# Patient Record
Sex: Female | Born: 1955 | Race: White | Hispanic: No | Marital: Single | Smoking: Never smoker
Health system: Southern US, Community
[De-identification: ages and names within clinical notes are randomized; demographics above are authoritative.]

## PROBLEM LIST (undated history)

## (undated) DIAGNOSIS — E079 Disorder of thyroid, unspecified: Secondary | ICD-10-CM

## (undated) DIAGNOSIS — E785 Hyperlipidemia, unspecified: Secondary | ICD-10-CM

## (undated) DIAGNOSIS — I10 Essential (primary) hypertension: Secondary | ICD-10-CM

## (undated) DIAGNOSIS — K219 Gastro-esophageal reflux disease without esophagitis: Secondary | ICD-10-CM

## (undated) DIAGNOSIS — F319 Bipolar disorder, unspecified: Secondary | ICD-10-CM

## (undated) DIAGNOSIS — R748 Abnormal levels of other serum enzymes: Secondary | ICD-10-CM

## (undated) HISTORY — PX: TONSILLECTOMY: SUR1361

## (undated) HISTORY — PX: CHOLECYSTECTOMY: SHX55

---

## 2016-11-07 ENCOUNTER — Encounter (HOSPITAL_COMMUNITY): Payer: Self-pay

## 2016-11-07 ENCOUNTER — Other Ambulatory Visit: Payer: Self-pay

## 2016-11-07 DIAGNOSIS — R0789 Other chest pain: Secondary | ICD-10-CM | POA: Diagnosis present

## 2016-11-07 DIAGNOSIS — I209 Angina pectoris, unspecified: Secondary | ICD-10-CM | POA: Diagnosis not present

## 2016-11-07 DIAGNOSIS — I10 Essential (primary) hypertension: Secondary | ICD-10-CM | POA: Diagnosis not present

## 2016-11-07 NOTE — ED Triage Notes (Signed)
Per EMS, pt from jail with complaint of left sided chest pain with radiation down left arm. Pt has hx of anxiety. Pt denies sob but endorses 3 episodes of vomiting this evening pta. Pt given 1 nitro without relief by ems. VSS. 12 lead showed NSR.

## 2016-11-08 ENCOUNTER — Emergency Department (HOSPITAL_COMMUNITY)

## 2016-11-08 ENCOUNTER — Observation Stay (HOSPITAL_COMMUNITY)
Admission: EM | Admit: 2016-11-08 | Discharge: 2016-11-09 | Disposition: A | Discharge status: Home / Self Care | Attending: Family Medicine | Admitting: Family Medicine

## 2016-11-08 DIAGNOSIS — E669 Obesity, unspecified: Secondary | ICD-10-CM

## 2016-11-08 DIAGNOSIS — F319 Bipolar disorder, unspecified: Secondary | ICD-10-CM

## 2016-11-08 DIAGNOSIS — E785 Hyperlipidemia, unspecified: Secondary | ICD-10-CM

## 2016-11-08 DIAGNOSIS — F1910 Other psychoactive substance abuse, uncomplicated: Secondary | ICD-10-CM

## 2016-11-08 DIAGNOSIS — I209 Angina pectoris, unspecified: Secondary | ICD-10-CM | POA: Diagnosis present

## 2016-11-08 DIAGNOSIS — K219 Gastro-esophageal reflux disease without esophagitis: Secondary | ICD-10-CM

## 2016-11-08 DIAGNOSIS — N179 Acute kidney failure, unspecified: Secondary | ICD-10-CM

## 2016-11-08 DIAGNOSIS — E079 Disorder of thyroid, unspecified: Secondary | ICD-10-CM

## 2016-11-08 DIAGNOSIS — I10 Essential (primary) hypertension: Secondary | ICD-10-CM

## 2016-11-08 HISTORY — DX: Abnormal levels of other serum enzymes: R74.8

## 2016-11-08 HISTORY — DX: Essential (primary) hypertension: I10

## 2016-11-08 HISTORY — DX: Disorder of thyroid, unspecified: E07.9

## 2016-11-08 HISTORY — DX: Bipolar disorder, unspecified: F31.9

## 2016-11-08 HISTORY — DX: Gastro-esophageal reflux disease without esophagitis: K21.9

## 2016-11-08 HISTORY — DX: Hyperlipidemia, unspecified: E78.5

## 2016-11-08 LAB — LIPID PANEL
CHOLESTEROL: 228 mg/dL — AB (ref 0–200)
HDL: 68 mg/dL (ref >40)
LDL Cholesterol: 133 mg/dL — ABNORMAL HIGH (ref 0–99)
TRIGLYCERIDES: 135 mg/dL (ref <150)
Total CHOL/HDL Ratio: 3.4 RATIO
VLDL: 27 mg/dL (ref 0–40)

## 2016-11-08 LAB — CBC
HCT: 42.4 % (ref 36.0–46.0)
Hemoglobin: 13.8 g/dL (ref 12.0–15.0)
MCH: 30.3 pg (ref 26.0–34.0)
MCHC: 32.5 g/dL (ref 30.0–36.0)
MCV: 93.2 fL (ref 78.0–100.0)
Platelets: 197 10*3/uL (ref 150–400)
RBC: 4.55 MIL/uL (ref 3.87–5.11)
RDW: 14.2 % (ref 11.5–15.5)
WBC: 9.7 10*3/uL (ref 4.0–10.5)

## 2016-11-08 LAB — TROPONIN I
Troponin I: <0.03 ng/mL (ref <0.03)
Troponin I: <0.03 ng/mL (ref <0.03)
Troponin I: <0.03 ng/mL (ref <0.03)

## 2016-11-08 LAB — BASIC METABOLIC PANEL
Anion gap: 10 (ref 5–15)
BUN: 16 mg/dL (ref 6–20)
CALCIUM: 9.5 mg/dL (ref 8.9–10.3)
CO2: 23 mmol/L (ref 22–32)
CREATININE: 1.3 mg/dL — AB (ref 0.44–1.00)
Chloride: 106 mmol/L (ref 101–111)
GFR calc Af Amer: 51 mL/min — ABNORMAL LOW (ref >60)
GFR, EST NON AFRICAN AMERICAN: 44 mL/min — AB (ref >60)
GLUCOSE: 104 mg/dL — AB (ref 65–99)
Potassium: 3.8 mmol/L (ref 3.5–5.1)
Sodium: 139 mmol/L (ref 135–145)

## 2016-11-08 LAB — TSH: TSH: 4.613 u[IU]/mL — ABNORMAL HIGH (ref 0.350–4.500)

## 2016-11-08 LAB — MRSA PCR SCREENING: MRSA BY PCR: NEGATIVE

## 2016-11-08 MED ORDER — SODIUM CHLORIDE 0.9 % IV SOLN
INTRAVENOUS | Status: DC
  Administered 2016-11-08 (×2): via INTRAVENOUS

## 2016-11-08 MED ORDER — POLYETHYLENE GLYCOL 3350 17 G PO PACK
17.0000 g | PACK | Freq: Every day | ORAL | Status: DC
  Administered 2016-11-08 – 2016-11-09 (×2): 17 g via ORAL
  Filled 2016-11-08 (×2): qty 1

## 2016-11-08 MED ORDER — ONDANSETRON HCL 4 MG/2ML IJ SOLN
4.0000 mg | Freq: Four times a day (QID) | INTRAMUSCULAR | Status: DC | PRN
  Administered 2016-11-08: 4 mg via INTRAVENOUS
  Filled 2016-11-08: qty 2

## 2016-11-08 MED ORDER — GI COCKTAIL ~~LOC~~
30.0000 mL | Freq: Four times a day (QID) | ORAL | Status: DC | PRN
  Administered 2016-11-09: 30 mL via ORAL
  Filled 2016-11-08: qty 30

## 2016-11-08 MED ORDER — NITROGLYCERIN 0.4 MG SL SUBL: 0.4000 mg | SUBLINGUAL_TABLET | SUBLINGUAL | Status: DC | PRN

## 2016-11-08 MED ORDER — ONDANSETRON 4 MG PO TBDP
ORAL_TABLET | ORAL | Status: AC
  Administered 2016-11-08: 4 mg
  Filled 2016-11-08: qty 1

## 2016-11-08 MED ORDER — ASPIRIN 81 MG PO CHEW
324.0000 mg | CHEWABLE_TABLET | Freq: Once | ORAL | Status: AC
  Administered 2016-11-08: 324 mg via ORAL
  Filled 2016-11-08: qty 4

## 2016-11-08 MED ORDER — MORPHINE SULFATE (PF) 4 MG/ML IV SOLN
2.0000 mg | INTRAVENOUS | Status: DC | PRN
  Administered 2016-11-08 – 2016-11-09 (×4): 2 mg via INTRAVENOUS
  Filled 2016-11-08 (×4): qty 1

## 2016-11-08 MED ORDER — POLYETHYLENE GLYCOL 3350 17 GM/SCOOP PO POWD
1.0000 | Freq: Once | ORAL | Status: DC
  Filled 2016-11-08: qty 255

## 2016-11-08 MED ORDER — NITROGLYCERIN 2 % TD OINT
1.0000 [in_us] | TOPICAL_OINTMENT | Freq: Four times a day (QID) | TRANSDERMAL | Status: DC
  Administered 2016-11-08 (×2): 1 [in_us] via TOPICAL
  Filled 2016-11-08: qty 30
  Filled 2016-11-08 (×2): qty 1

## 2016-11-08 MED ORDER — ACETAMINOPHEN 325 MG PO TABS
650.0000 mg | ORAL_TABLET | ORAL | Status: DC | PRN
  Administered 2016-11-08 – 2016-11-09 (×3): 650 mg via ORAL
  Filled 2016-11-08 (×3): qty 2

## 2016-11-08 MED ORDER — ZOLPIDEM TARTRATE 5 MG PO TABS: 5.0000 mg | ORAL_TABLET | Freq: Every evening | ORAL | Status: DC | PRN

## 2016-11-08 MED ORDER — ENOXAPARIN SODIUM 40 MG/0.4ML ~~LOC~~ SOLN
40.0000 mg | SUBCUTANEOUS | Status: DC
  Administered 2016-11-08 – 2016-11-09 (×2): 40 mg via SUBCUTANEOUS
  Filled 2016-11-08 (×2): qty 0.4

## 2016-11-08 MED ORDER — ASPIRIN EC 325 MG PO TBEC
325.0000 mg | DELAYED_RELEASE_TABLET | Freq: Every day | ORAL | Status: DC
  Administered 2016-11-09: 325 mg via ORAL
  Filled 2016-11-08: qty 1

## 2016-11-08 MED ORDER — MORPHINE SULFATE (PF) 4 MG/ML IV SOLN: 1.0000 mg | INTRAVENOUS | Status: DC | PRN

## 2016-11-08 MED ORDER — MORPHINE SULFATE (PF) 4 MG/ML IV SOLN
4.0000 mg | Freq: Once | INTRAVENOUS | Status: AC
  Administered 2016-11-08: 4 mg via INTRAVENOUS
  Filled 2016-11-08: qty 1

## 2016-11-08 MED ORDER — ALPRAZOLAM 0.25 MG PO TABS
0.2500 mg | ORAL_TABLET | Freq: Two times a day (BID) | ORAL | Status: DC | PRN
  Filled 2016-11-08: qty 1

## 2016-11-08 NOTE — ED Notes (Signed)
Breakfast tray delivered

## 2016-11-08 NOTE — ED Notes (Signed)
Pt complaining of nausea. Pt given zofran odt by this RN

## 2016-11-08 NOTE — ED Notes (Signed)
Patient complains of recurrent headache, morphine given. Patient alert and oriented, reports that her chest pain is intermittent. No distress on reassessment

## 2016-11-08 NOTE — Progress Notes (Signed)
Pt admitted to 3w19. Pt oriented to the room.

## 2016-11-08 NOTE — H&P (Signed)
This is a no charge note   Pending admission per Dr. Rhunette CroftNanavati  60 year old female lady with past medical history hypertension, hyperlipidemia, GERD, depression, bipolar, who presents left-sided chest pain, radiating into the left arm. The chest pain subsided after treated with nitroglycerin by EMS. Patient also had nausea and vomited 3 times. EKG has no ischemic change.  Initial troponin negative. Negative CXR. Patient is accepted.  Lorretta HarpXilin Adonai Selsor, MD  [facility] Pager [phone]  If 7PM-7AM, please contact night-coverage [email or web address] Password [email or web address] 11/08/2016, 6:26 AM

## 2016-11-08 NOTE — H&P (Signed)
History and Physical    Connie Mcdonald ZOX:[id number] DOB: October 01, 1956 DOA: 11/08/2016   PCP: Pcp Not In System   Patient coming from:  Prison  Chief Complaint: Chest pain   HPI: Connie Mcdonald is a 60 y.o. female with medical history significant for HTN, HLD, Gerd, depression, bipolar disorder, crack - cocaine abuse, currently incarcerated for drug possession, brought to the emergency department with acute onset of left sided chest pain radiating to the left arm, without extension to the jaw. The pain was described as 6 out of 10, subsiding after the treatment with nitroglycerin in an aspirin by EMS. The patient reported nausea and vomiting 3 times at the emergency department. Last crack intake 2 days ago. Pain began around 5 PM last night, that she did not reported until midnight. At the ED, she did not have any further episodes. Denies dizziness or falls. No syncope or presyncope, she denies any shortness of breath or call. She denies any fever or chills. She denies any abdominal pain and appetite is normal. She does eats salt rich foods. She denies any leg swelling or calf pain. She denies any headaches or vision changes or seizures. No confusion is reported. She has never been seen by a cardiologist, or had a cardiac catheterization or echocardiogram. No prior long distance trips before incarceration. She denies any hormonal therapy, but she is menopausal. She denies any herbal supplements, smoking or alcohol. She denies any family history of cardiac disease. The new stressors is being incarcerated.   ED Course:  BP 111/72 (BP Location: Right Arm)   Pulse 76   Temp 97.8 F (36.6 C) (Oral)   Resp 18   Ht 5\' 9"  (1.753 m)   Wt 108.9 kg (240 lb)   SpO2 96%   BMI 35.44 kg/m    vital signs are currently stable, EKG without ACS. Initial troponin is negative. Chest x-ray is negative for acute changes. Sodium 139 potassium 3.8 creatinine 1.3 white count 9.7 hemoglobin 13.8 platelets 197 glucose  104  Review of Systems: As per HPI otherwise 10 point review of systems negative.   Past Medical History:  Diagnosis Date  . Acid reflux   . Bipolar 1 disorder (HCC)   . Elevated liver enzymes   . Hyperlipidemia   . Hypertension   . Manic depression (HCC)   . Thyroid disease     Past Surgical History:  Procedure Laterality Date  . CHOLECYSTECTOMY    . TONSILLECTOMY      Social History Social History   Social History  . Marital status: Single    Spouse name: N/A  . Number of children: N/A  . Years of education: N/A   Occupational History  . Not on file.   Social History Main Topics  . Smoking status: Never Smoker  . Smokeless tobacco: Never Used  . Alcohol use Yes     Comment: occ  . Drug use: No  . Sexual activity: Not on file   Other Topics Concern  . Not on file   Social History Narrative  . No narrative on file     No Known Allergies  History reviewed. No pertinent family history.    Prior to Admission medications   Not on File    Physical Exam:    Vitals:   11/08/16 0608 11/08/16 0630 11/08/16 0715 11/08/16 0811  BP: 126/73  103/68 111/72  Pulse: 75 85 76 76  Resp: 23 19 20 18   Temp:  TempSrc:      SpO2: 97% 97% 94% 96%  Weight:      Height:           Constitutional: NAD, calm, comfortable  Vitals:   11/08/16 0608 11/08/16 0630 11/08/16 0715 11/08/16 0811  BP: 126/73  103/68 111/72  Pulse: 75 85 76 76  Resp: 23 19 20 18   Temp:      TempSrc:      SpO2: 97% 97% 94% 96%  Weight:      Height:       Eyes: PERRL, lids and conjunctivae normal ENMT: Mucous membranes are moist. Posterior pharynx clear of any exudate or lesions.Normal dentition.  Neck: normal, supple, no masses, no thyromegaly Respiratory: clear to auscultation bilaterally, no wheezing, no crackles. Normal respiratory effort. No accessory muscle use.  Cardiovascular: Regular rate and rhythm, no murmurs / rubs / gallops. No extremity edema. 2+ pedal pulses. No  carotid bruits.  Abdomen:  Obese no tenderness, no masses palpated. No hepatosplenomegaly. Bowel sounds positive.  Musculoskeletal: no clubbing / cyanosis. No joint deformity upper and lower extremities. Good ROM, no contractures. Normal muscle tone.  Skin: no rashes, lesions, ulcers.  Neurologic: CN 2-12 grossly intact. Sensation intact, DTR normal. Strength 5/5 in all 4.  Psychiatric: Normal judgment and insight. Alert and oriented x 3.Anxious mood.     Labs on Admission: I have personally reviewed following labs and imaging studies  CBC:  Recent Labs Lab 11/07/16 2353  WBC 9.7  HGB 13.8  HCT 42.4  MCV 93.2  PLT 197    Basic Metabolic Panel:  Recent Labs Lab 11/07/16 2353  NA 139  K 3.8  CL 106  CO2 23  GLUCOSE 104*  BUN 16  CREATININE 1.30*  CALCIUM 9.5    GFR: Estimated Creatinine Clearance: 62 mL/min (by C-G formula based on SCr of 1.3 mg/dL (H)).  Liver Function Tests: No results for input(s): AST, ALT, ALKPHOS, BILITOT, PROT, ALBUMIN in the last 168 hours. No results for input(s): LIPASE, AMYLASE in the last 168 hours. No results for input(s): AMMONIA in the last 168 hours.  Coagulation Profile: No results for input(s): INR, PROTIME in the last 168 hours.  Cardiac Enzymes:  Recent Labs Lab 11/07/16 2353 11/08/16 0337  TROPONINI <0.03 <0.03    BNP (last 3 results) No results for input(s): PROBNP in the last 8760 hours.  HbA1C: No results for input(s): HGBA1C in the last 72 hours.  CBG: No results for input(s): GLUCAP in the last 168 hours.  Lipid Profile: No results for input(s): CHOL, HDL, LDLCALC, TRIG, CHOLHDL, LDLDIRECT in the last 72 hours.  Thyroid Function Tests: No results for input(s): TSH, T4TOTAL, FREET4, T3FREE, THYROIDAB in the last 72 hours.  Anemia Panel: No results for input(s): VITAMINB12, FOLATE, FERRITIN, TIBC, IRON, RETICCTPCT in the last 72 hours.  Urine analysis: No results found for: COLORURINE, APPEARANCEUR,  LABSPEC, PHURINE, GLUCOSEU, HGBUR, BILIRUBINUR, KETONESUR, PROTEINUR, UROBILINOGEN, NITRITE, LEUKOCYTESUR  Sepsis Labs: @LABRCNTIP (procalcitonin:4,lacticidven:4) )No results found for this or any previous visit (from the past 240 hour(s)).   Radiological Exams on Admission: Dg Chest 2 View  Result Date: 11/08/2016 CLINICAL DATA:  Left-sided chest pain and left arm pain for 2 hours. EXAM: CHEST  2 VIEW COMPARISON:  None. FINDINGS: The cardiomediastinal contours are normal. The lungs are clear. Pulmonary vasculature is normal. No consolidation, pleural effusion, or pneumothorax. No acute osseous abnormalities are seen. IMPRESSION: No acute pulmonary process. Electronically Signed   By: Lujean RaveMelanie  Ehinger M.D.  On: 11/08/2016 00:34    EKG: Independently reviewed.  Assessment/Plan Active Problems:   Angina pectoris (HCC)   Drug abuse   Obesity   Hypertension   Hyperlipidemia   Bipolar disorder (HCC)   Manic depression (HCC)   Thyroid disease   GERD (gastroesophageal reflux disease)   AKI (acute kidney injury) (HCC)    Chest pain syndrome, cardiac versus musculoskeletal vs anxietyin the setting of recent crack use Troponin 0.03 , EKG without evidence of ACS. CP relieved by nitroglycerin, morphine, aspirin. CXR unrevealing.   Admit to Telemetry/ Observation Chest pain order set Cycle troponins EKG in am continue ASA, O2 and NTG as needed No BB given due to recent drug use  GI cocktail Check Lipid panel  Hb A1C Consult to Cards if  Tn or EKG changes  Monitor for drug withdrawal symptoms     Hypertension BP 111/72  Pulse 76    Continue to monitor    Hyperlipidemia patient not on statins, check lipid panel   Obesity  Heart healthy diet  Check Hb A1C   GERD,  Continue PPI  Hypothyroidism: Patient was not on meds due to inability to afford Check TSH   Acute Kidney Injury likely due to dehydration due to vomiting  Lab Results  Component Value Date   CREATININE  1.30 (H) 11/07/2016  IVF Check UA    Bipolar Disorder/ Manic Depression. No active issues at this time  Patient not on meds.  Will need Psych eval as OP   Substance abuse, last crack intake 2 days ago  Monitor for withdrawal  Xanax and Ambien prn    DVT prophylaxis: Lovenox   Code Status:   Full      Family Communication:  Discussed with patient Disposition Plan: Expect patient to be discharged to prison after condition improves Consults called:    None Admission status:Tele  Obs  Shelita Steptoe E, PA-C [facility]   11/08/2016, 8:37 AM

## 2016-11-08 NOTE — ED Provider Notes (Signed)
[facility]-EMERGENCY DEPT Provider Note   CSN: [id number] Arrival date & time: 11/07/16  2339  By signing my name below, I, Connie Mcdonald, attest that this documentation has been prepared under the direction and in the presence of Derwood Kaplan, MD. Electronically Signed: Javier Mcdonald, ER Scribe. 06/21/2016. 3:37 AM.  History   Chief Complaint Chief Complaint  Patient presents with  . Chest Pain  . Headache   The history is provided by the patient. No language interpreter was used.    HPI Comments: Connie Mcdonald is a 60 y.o. female brought in [facility] jail who presents to the Emergency Department complaining of chest discomfort for the past two weeks that got worse today with associated left arm pain, numbness and tingling in her arm. She has a PMHx of HTN, HLD, panic attacks and prediabetes. She states her current sx are not similar to her panic attack sx. She denies family hx of heart disease. She has been prescribed vistaril in the past for panic attacks, but is not currently taking that medication. She has never had a stress test. The last time she ate was around 2pm yesterday. She was given nitroglycerine PTA which lowered her CP from a 9/10 to a 6/10. She states her pain is currently 3/10. She has no past hx of blood clots or recent major surgeries. Her CP is worse with deep breathing. She has NKDA.  Past Medical History:  Diagnosis Date  . Acid reflux   . Bipolar 1 disorder (HCC)   . Elevated liver enzymes   . Hyperlipidemia   . Hypertension   . Manic depression (HCC)   . Thyroid disease     Patient Active Problem List   Diagnosis Date Noted  . Angina pectoris (HCC) 11/08/2016  . Drug abuse 11/08/2016  . Obesity 11/08/2016  . Hypertension 11/08/2016  . Hyperlipidemia 11/08/2016  . Bipolar disorder (HCC) 11/08/2016  . Manic depression (HCC) 11/08/2016  . Thyroid disease 11/08/2016  . GERD (gastroesophageal reflux disease) 11/08/2016  . AKI (acute  kidney injury) (HCC) 11/08/2016    Past Surgical History:  Procedure Laterality Date  . CHOLECYSTECTOMY    . TONSILLECTOMY      OB History    No data available       Home Medications    Prior to Admission medications   Medication Sig Start Date End Date Taking? Authorizing Provider  DULoxetine (CYMBALTA) 30 MG capsule Take 1 capsule (30 mg total) by mouth daily. 11/10/16   Lenox Ponds, MD  hydrOXYzine (ATARAX/VISTARIL) 10 MG tablet Take 1 tablet (10 mg total) by mouth every 6 (six) hours as needed for anxiety. 11/09/16   Lenox Ponds, MD    Family History History reviewed. No pertinent family history.  Social History Social History  Substance Use Topics  . Smoking status: Never Smoker  . Smokeless tobacco: Never Used  . Alcohol use Yes     Comment: occ     Allergies   Patient has no known allergies.   Review of Systems Review of Systems  Constitutional: Negative for chills and fever.  Respiratory: Positive for chest tightness. Negative for shortness of breath.   Cardiovascular: Positive for chest pain.  A complete 10 system review of systems was obtained and all systems are negative except as noted in the HPI and PMH.    Physical Exam Updated Vital Signs BP 138/78 (BP Location: Left Arm)   Pulse 71   Temp 98.3 F (36.8  C) (Oral)   Resp 18   Ht 5\' 9"  (1.753 m)   Wt 241 lb 1.6 oz (109.4 kg)   SpO2 97%   BMI 35.60 kg/m   Physical Exam  Constitutional: She is oriented to person, place, and time. She appears well-developed and well-nourished. No distress.  HENT:  Head: Normocephalic and atraumatic.  Eyes: Pupils are equal, round, and reactive to light.  Neck: Neck supple.  Cardiovascular: Normal rate.   Pulmonary/Chest: Effort normal. No respiratory distress.  Musculoskeletal: Normal range of motion.  Neurological: She is alert and oriented to person, place, and time. Coordination normal.  Skin: Skin is warm and dry. She is not diaphoretic.   Psychiatric: She has a normal mood and affect. Her behavior is normal.  Nursing note and vitals reviewed.    ED Treatments / Results  Labs (all labs ordered are listed, but only abnormal results are displayed) Labs Reviewed  BASIC METABOLIC PANEL - Abnormal; Notable for the following:       Result Value   Glucose, Bld 104 (*)    Creatinine, Ser 1.30 (*)    GFR calc non Af Amer 44 (*)    GFR calc Af Amer 51 (*)    All other components within normal limits  LIPID PANEL - Abnormal; Notable for the following:    Cholesterol 228 (*)    LDL Cholesterol 133 (*)    All other components within normal limits  TSH - Abnormal; Notable for the following:    TSH 4.613 (*)    All other components within normal limits  URINALYSIS, ROUTINE W REFLEX MICROSCOPIC - Abnormal; Notable for the following:    APPearance HAZY (*)    Leukocytes, UA LARGE (*)    Bacteria, UA RARE (*)    Squamous Epithelial / LPF 0-5 (*)    All other components within normal limits  CBC - Abnormal; Notable for the following:    Platelets 142 (*)    All other components within normal limits  COMPREHENSIVE METABOLIC PANEL - Abnormal; Notable for the following:    BUN 21 (*)    Creatinine, Ser 1.30 (*)    Calcium 8.8 (*)    Albumin 3.1 (*)    GFR calc non Af Amer 44 (*)    GFR calc Af Amer 51 (*)    All other components within normal limits  MRSA PCR SCREENING  CBC  TROPONIN I  TROPONIN I  TROPONIN I  TROPONIN I  TROPONIN I  HEMOGLOBIN A1C  T4, FREE    EKG  EKG Interpretation  Date/Time:  Saturday November 08 2016 13:06:41 EST Ventricular Rate:  71 PR Interval:  150 QRS Duration: 84 QT Interval:  390 QTC Calculation: 424 R Axis:   33 Text Interpretation:  Sinus rhythm No acute changes Confirmed by LIU MD, Annabelle HarmanANA ([id number]) on 11/09/2016 1:39:26 PM       Radiology No results found.  Procedures Procedures (including critical care time)  Medications Ordered in ED Medications  ondansetron  (ZOFRAN-ODT) 4 MG disintegrating tablet (4 mg  Given 11/08/16 0150)  aspirin chewable tablet 324 mg (324 mg Oral Given 11/08/16 0413)  morphine 4 MG/ML injection 4 mg (4 mg Intravenous Given 11/08/16 0414)     Initial Impression / Assessment and Plan / ED Course  I have reviewed the triage vital signs and the nursing notes.  Pertinent labs & imaging results that were available during my care of the patient were reviewed by me and considered in  my medical decision making (see chart for details).  Clinical Course    Pt comes in with cc of chest pain. Intermittent for the past few days, but more pronounced yday. Pt's pain has typical features. EKG is however reassuring. Pt received nitro by EMS and has been chest pain free. Pt has hx of cocaine use and smoking. Her pain has some atypical features -for e/g she thinks that the pain was worse with inspiration. However, with HEAR score of 5 (2 for hx and 2 for risk factor and 1 for age), and pt currently imprisoned - we will admit for ACS r/o.   Final Clinical Impressions(s) / ED Diagnoses   Final diagnoses:  Angina pectoris [facility](HCC)    New Prescriptions Discharge Medication List as of 11/09/2016  5:38 PM    START taking these medications   Details  DULoxetine (CYMBALTA) 30 MG capsule Take 1 capsule (30 mg total) by mouth daily., Starting Mon 11/10/2016, No Print    hydrOXYzine (ATARAX/VISTARIL) 10 MG tablet Take 1 tablet (10 mg total) by mouth every 6 (six) hours as needed for anxiety., Starting Sun 11/09/2016, No Print        I personally performed the services described in this documentation, which was scribed in my presence. The recorded information has been reviewed and is accurate.        Derwood KaplanAnkit Ladavion Savitz, MD 11/10/16 [phone]

## 2016-11-08 NOTE — ED Notes (Addendum)
Breakfast tray ordered 

## 2016-11-09 ENCOUNTER — Observation Stay (HOSPITAL_BASED_OUTPATIENT_CLINIC_OR_DEPARTMENT_OTHER)

## 2016-11-09 DIAGNOSIS — F411 Generalized anxiety disorder: Secondary | ICD-10-CM

## 2016-11-09 DIAGNOSIS — R079 Chest pain, unspecified: Secondary | ICD-10-CM | POA: Diagnosis not present

## 2016-11-09 DIAGNOSIS — I10 Essential (primary) hypertension: Secondary | ICD-10-CM | POA: Diagnosis not present

## 2016-11-09 DIAGNOSIS — F310 Bipolar disorder, current episode hypomanic: Secondary | ICD-10-CM

## 2016-11-09 DIAGNOSIS — I209 Angina pectoris, unspecified: Secondary | ICD-10-CM

## 2016-11-09 DIAGNOSIS — E6609 Other obesity due to excess calories: Secondary | ICD-10-CM

## 2016-11-09 LAB — URINALYSIS, ROUTINE W REFLEX MICROSCOPIC
BILIRUBIN URINE: NEGATIVE
GLUCOSE, UA: NEGATIVE mg/dL
HGB URINE DIPSTICK: NEGATIVE
Ketones, ur: NEGATIVE mg/dL
NITRITE: NEGATIVE
PROTEIN: NEGATIVE mg/dL
Specific Gravity, Urine: 1.015 (ref 1.005–1.030)
pH: 6 (ref 5.0–8.0)

## 2016-11-09 LAB — COMPREHENSIVE METABOLIC PANEL WITH GFR
ALT: 42 U/L (ref 14–54)
AST: 30 U/L (ref 15–41)
Albumin: 3.1 g/dL — ABNORMAL LOW (ref 3.5–5.0)
Alkaline Phosphatase: 83 U/L (ref 38–126)
Anion gap: 5 (ref 5–15)
BUN: 21 mg/dL — ABNORMAL HIGH (ref 6–20)
CO2: 25 mmol/L (ref 22–32)
Calcium: 8.8 mg/dL — ABNORMAL LOW (ref 8.9–10.3)
Chloride: 109 mmol/L (ref 101–111)
Creatinine, Ser: 1.3 mg/dL — ABNORMAL HIGH (ref 0.44–1.00)
GFR calc Af Amer: 51 mL/min — ABNORMAL LOW
GFR calc non Af Amer: 44 mL/min — ABNORMAL LOW
Glucose, Bld: 98 mg/dL (ref 65–99)
Potassium: 3.9 mmol/L (ref 3.5–5.1)
Sodium: 139 mmol/L (ref 135–145)
Total Bilirubin: 0.7 mg/dL (ref 0.3–1.2)
Total Protein: 6.7 g/dL (ref 6.5–8.1)

## 2016-11-09 LAB — HEMOGLOBIN A1C
Hgb A1c MFr Bld: 5.5 % (ref 4.8–5.6)
Mean Plasma Glucose: 111 mg/dL

## 2016-11-09 LAB — CBC
HCT: 40.5 % (ref 36.0–46.0)
Hemoglobin: 12.9 g/dL (ref 12.0–15.0)
MCH: 30.1 pg (ref 26.0–34.0)
MCHC: 31.9 g/dL (ref 30.0–36.0)
MCV: 94.4 fL (ref 78.0–100.0)
PLATELETS: 142 10*3/uL — AB (ref 150–400)
RBC: 4.29 MIL/uL (ref 3.87–5.11)
RDW: 14.2 % (ref 11.5–15.5)
WBC: 8.8 10*3/uL (ref 4.0–10.5)

## 2016-11-09 LAB — ECHOCARDIOGRAM COMPLETE
HEIGHTINCHES: 69 in
WEIGHTICAEL: 3857.6 [oz_av]

## 2016-11-09 LAB — T4, FREE: FREE T4: 0.85 ng/dL (ref 0.61–1.12)

## 2016-11-09 MED ORDER — DULOXETINE HCL 30 MG PO CPEP: 30.0000 mg | ORAL_CAPSULE | Freq: Every day | ORAL | 3 refills | Status: AC

## 2016-11-09 MED ORDER — TRAMADOL HCL 50 MG PO TABS
50.0000 mg | ORAL_TABLET | Freq: Four times a day (QID) | ORAL | Status: DC | PRN
  Administered 2016-11-09: 50 mg via ORAL
  Filled 2016-11-09: qty 1

## 2016-11-09 MED ORDER — HYDROXYZINE HCL 10 MG PO TABS: 10.0000 mg | ORAL_TABLET | Freq: Four times a day (QID) | ORAL | 0 refills | Status: AC | PRN

## 2016-11-09 MED ORDER — DULOXETINE HCL 30 MG PO CPEP
30.0000 mg | ORAL_CAPSULE | Freq: Every day | ORAL | Status: DC
  Administered 2016-11-09: 30 mg via ORAL
  Filled 2016-11-09: qty 1

## 2016-11-09 MED ORDER — HYDROXYZINE HCL 10 MG PO TABS
10.0000 mg | ORAL_TABLET | Freq: Four times a day (QID) | ORAL | Status: DC | PRN
  Administered 2016-11-09: 10 mg via ORAL
  Filled 2016-11-09: qty 1

## 2016-11-09 NOTE — Consult Note (Signed)
Admit date: 11/08/2016 Referring Physician  Dr. Sharion DoveZapata Primary Physician Pcp Not In System Primary Cardiologist  New Reason for Consultation  Chest pain  HPI: 60 year old female prisoner with history of hypertension, hyperlipidemia, bipolar disorder who was admitted with chest pain. She describes chest pain since Friday pressure-like sometimes shooting down her left arm periodically. She is also mainly concerned as well about numbness in her left hand especially when sleeping that wakes her up periodically. Her brother recently had cervical spine surgery to help with radiculopathy. She also notes that she "threw up "several times yesterday. No hematemesis. She also has a history of elevated liver enzyme she told me.  She does not smoke. She does not have diabetes. Her mother and father both had hypertension.   She is asking to eat.   PMH:   Past Medical History:  Diagnosis Date  . Acid reflux   . Bipolar 1 disorder (HCC)   . Elevated liver enzymes   . Hyperlipidemia   . Hypertension   . Manic depression (HCC)   . Thyroid disease     PSH:   Past Surgical History:  Procedure Laterality Date  . CHOLECYSTECTOMY    . TONSILLECTOMY     Allergies:  Patient has no known allergies. Prior to Admit Meds:   Prior to Admission medications   Not on File    Scheduled Meds: . aspirin EC  325 mg Oral Daily  . enoxaparin (LOVENOX) injection  40 mg Subcutaneous Q24H  . nitroGLYCERIN  1 inch Topical Q6H  . polyethylene glycol  17 g Oral Daily   Continuous Infusions: . sodium chloride 75 mL/hr at 11/08/16 1929   PRN Meds:.acetaminophen, ALPRAZolam, gi cocktail, nitroGLYCERIN, ondansetron (ZOFRAN) IV, zolpidem  Fam HX:   Both mother and father had hypertension, no early CAD Social HX:    Social History   Social History  . Marital status: Single    Spouse name: N/A  . Number of children: N/A  . Years of education: N/A   Occupational History  . Not on file.   Social History  Main Topics  . Smoking status: Never Smoker  . Smokeless tobacco: Never Used  . Alcohol use Yes     Comment: occ  . Drug use: No  . Sexual activity: Not on file   Other Topics Concern  . Not on file   Social History Narrative  . No narrative on file     ROS:  No syncope, no bleeding, no orthopnea, no PND. All 11 ROS were addressed and are negative except what is stated in the HPI   Physical Exam: Blood pressure 114/80, pulse 74, temperature 98 F (36.7 C), temperature source Oral, resp. rate 19, height 5\' 9"  (1.753 m), weight 241 lb 1.6 oz (109.4 kg), SpO2 95 %.   General: Well developed, well nourished, in no acute distress Head: Eyes PERRLA, No xanthomas.   Normal cephalic and atramatic  Lungs:   Clear bilaterally to auscultation and percussion. Normal respiratory effort. No wheezes, no rales. Heart:   HRRR S1 S2 Pulses are 2+ & equal. No murmur, rubs, gallops.  No carotid bruit. No JVD.  No abdominal bruits.  Abdomen: Bowel sounds are positive, abdomen soft and non-tender without masses. No hepatosplenomegaly. Obese Msk:  Back normal. Normal strength and tone for age. Extremities:  No clubbing, cyanosis or edema.  DP +1 Neuro: Alert and oriented X 3, non-focal, MAE x 4 GU: Deferred Rectal: Deferred Psych:  Good affect, responds appropriately  Labs: Lab Results  Component Value Date   WBC 8.8 11/09/2016   HGB 12.9 11/09/2016   HCT 40.5 11/09/2016   MCV 94.4 11/09/2016   PLT 142 (L) 11/09/2016     Recent Labs Lab 11/09/16 0707  NA 139  K 3.9  CL 109  CO2 25  BUN 21*  CREATININE 1.30*  CALCIUM 8.8*  PROT 6.7  BILITOT 0.7  ALKPHOS 83  ALT 42  AST 30  GLUCOSE 98    Recent Labs  11/08/16 0337 11/08/16 0758 11/08/16 1312 11/08/16 1908  TROPONINI <0.03 <0.03 <0.03 <0.03   Lab Results  Component Value Date   CHOL 228 (H) 11/08/2016   HDL 68 11/08/2016   LDLCALC 133 (H) 11/08/2016   TRIG 135 11/08/2016   No results found for: DDIMER     Radiology:  Dg Chest 2 View  Result Date: 11/08/2016 CLINICAL DATA:  Left-sided chest pain and left arm pain for 2 hours. EXAM: CHEST  2 VIEW COMPARISON:  None. FINDINGS: The cardiomediastinal contours are normal. The lungs are clear. Pulmonary vasculature is normal. No consolidation, pleural effusion, or pneumothorax. No acute osseous abnormalities are seen. IMPRESSION: No acute pulmonary process. Electronically Signed   By: Rubye OaksMelanie  Ehinger M.D.   On: 11/08/2016 00:34   Personally viewed.  EKG:  Sinus rhythm, no ST segment changes Personally viewed.   Echocardiogram: 11/09/16-  - Reviewed images, ejection fraction normal, no wall motion abnormality, diastolic dysfunction grade 1, no significant valvular abnormalities noted. Reassuring.  ASSESSMENT/PLAN:    60 year old female with hypertension, hyperlipidemia, bipolar disorder, prisoner with chest pain, left arm pain, left hand numbness, recent emesis, GERD.  Atypical chest pain  - Certainly her discomfort could be attributed to possible gastroenteritis especially given her multiple bouts of emesis yesterday. She's been having her discomfort since Friday, there is no evidence of elevated troponin thankfully. No exertional component. Her EKG is without ST segment changes. Echocardiogram also reassuring showing no wall motion abnormalities with normal ejection fraction. Her left arm discomfort is periodically described as shooting pain which is most likely neuropathic and her left hand numbness is likely associated with carpal tunnel syndrome which she has been told in the past she may have.  - No further cardiac testing warranted. Reassuring workup.  - Comfortable with discharge from a cardiac perspective.  - Consider addition of statin therapy given LDL of 138  - Continue with aspirin 81 mg  - Consider PPI/treatment of possible gastroenteritis  Obesity  - Weight loss  We will sign off. Please let us know if you have any  questions.  Donato SchultzMark Mirah Nevins, MD  11/09/2016  8:36 AM

## 2016-11-09 NOTE — Discharge Summary (Addendum)
Physician Discharge Summary  Connie Mcdonald  ZOX:[id number]  DOB: October 12, 1956  DOA: 11/08/2016 PCP: Pcp Not In System  Admit date: 11/08/2016 Discharge date: 11/09/2016  Admitted From: Jail  Disposition:  Jail   Recommendations for Outpatient Follow-up:  1. Follow up with PCP in 1-2 weeks 2. Please obtain BMP/CBC in one week  Discharge Condition: Stable  CODE STATUS: FULL  Diet recommendation: Heart Healthy  Brief/Interim Summary: If the-year-old female with history of hypertension, hyperlipidemia, bipolar disorder, she is prisoner came complaining of chest pain, left arm pain and anxiety. She was admitted to rule out ACS. EKG is no changes TNI is negative normal echocardiogram. Cardiology evaluation done, no ACS.   Subjective: Patient seen and examined with police office at bedside, she reports being anxious since she hasn't taken her anxiety medication. Attributing the chest pain to the anxiety. Chest pain at this time has resolved. She does have some msk  tenderness in the left shoulder which is attributed to chronic arthritis.  Discharge Diagnoses/Hospital Course:  Atypical chest pain - 2/2 anxiety  EKG no ST changes, ECHO reassuring shows no wall motion abnormality, TNIs negative  Cardiology evaluated and cleared for discharge  Will continue home medications  Continue ASA 81 mg daily  Patient report being on statin at home - advised to continue for secondary prevention.   Hypertension - stable  Continue home medications   Anxiety  Continue Cymbalta and Vistaril   Obesity  Weight loss advised   Arthritis  Tramadol given - pain improve  Tylenol or Ibuprofen as outpatient   ? AKI - seen to be chronic in origin GFR 44 - Cr stable  Monitor BMP in 1 week  Encourage oral hydration   Discharge Instructions  Discharge Instructions    Call MD for:  difficulty breathing, headache or visual disturbances    Complete by:  As directed    Call MD for:  extreme fatigue     Complete by:  As directed    Call MD for:  hives    Complete by:  As directed    Call MD for:  persistant dizziness or light-headedness    Complete by:  As directed    Call MD for:  persistant nausea and vomiting    Complete by:  As directed    Call MD for:  redness, tenderness, or signs of infection (pain, swelling, redness, odor or green/yellow discharge around incision site)    Complete by:  As directed    Call MD for:  severe uncontrolled pain    Complete by:  As directed    Call MD for:  temperature >100.4    Complete by:  As directed    Diet - low sodium heart healthy    Complete by:  As directed    Discharge instructions    Complete by:  As directed    Increase activity slowly    Complete by:  As directed      Allergies as of 11/09/2016   No Known Allergies     Medication List    TAKE these medications   DULoxetine 30 MG capsule Commonly known as:  CYMBALTA Take 1 capsule (30 mg total) by mouth daily. Start taking on:  11/10/2016   hydrOXYzine 10 MG tablet Commonly known as:  ATARAX/VISTARIL Take 1 tablet (10 mg total) by mouth every 6 (six) hours as needed for anxiety.       No Known Allergies  Consultations:  None    Procedures/Studies: Dg Chest  2 View  Result Date: 11/08/2016 CLINICAL DATA:  Left-sided chest pain and left arm pain for 2 hours. EXAM: CHEST  2 VIEW COMPARISON:  None. FINDINGS: The cardiomediastinal contours are normal. The lungs are clear. Pulmonary vasculature is normal. No consolidation, pleural effusion, or pneumothorax. No acute osseous abnormalities are seen. IMPRESSION: No acute pulmonary process. Electronically Signed   By: Rubye OaksMelanie  Ehinger M.D.   On: 11/08/2016 00:34    ECHO  ------------------------------------------------------------------- Study Conclusions  - Left ventricle: The cavity size was normal. Wall thickness was   increased in a pattern of mild LVH. Systolic function was normal.   The estimated ejection fraction  was in the range of 55% to 60%.   Wall motion was normal; there were no regional wall motion   abnormalities. Doppler parameters are consistent with abnormal   left ventricular relaxation (grade 1 diastolic dysfunction).  -------------------------------------------------------------------   Discharge Exam: Vitals:   11/09/16 0042 11/09/16 0513  BP: 120/85 114/80  Pulse: 77 74  Resp: 19 19  Temp:  98 F (36.7 C)   Vitals:   11/08/16 2342 11/09/16 0012 11/09/16 0042 11/09/16 0513  BP: 125/78 108/72 120/85 114/80  Pulse: 67 73 77 74  Resp: 16 14 19 19   Temp:    98 F (36.7 C)  TempSrc:    Oral  SpO2: 95% 96% 96% 95%  Weight:      Height:        General: Pt is alert, awake, not in acute distress Cardiovascular: RRR, S1/S2 +, no rubs, no gallops Respiratory: CTA bilaterally, no wheezing, no rhonchi Abdominal: Obese soft, NT, ND, bowel sounds + Extremities: no edema, no cyanosis, Full ROM in all ext, chain cuff in feet    The results of significant diagnostics from this hospitalization (including imaging, microbiology, ancillary and laboratory) are listed below for reference.     Microbiology: Recent Results (from the past 240 hour(s))  MRSA PCR Screening     Status: None   Collection Time: 11/08/16  4:13 PM  Result Value Ref Range Status   MRSA by PCR NEGATIVE NEGATIVE Final    Comment:        The GeneXpert MRSA Assay (FDA approved for NASAL specimens only), is one component of a comprehensive MRSA colonization surveillance program. It is not intended to diagnose MRSA infection nor to guide or monitor treatment for MRSA infections.      Labs: BNP (last 3 results) No results for input(s): BNP in the last 8760 hours. Basic Metabolic Panel:  Recent Labs Lab 11/07/16 2353 11/09/16 0707  NA 139 139  K 3.8 3.9  CL 106 109  CO2 23 25  GLUCOSE 104* 98  BUN 16 21*  CREATININE 1.30* 1.30*  CALCIUM 9.5 8.8*   Liver Function Tests:  Recent Labs Lab  11/09/16 0707  AST 30  ALT 42  ALKPHOS 83  BILITOT 0.7  PROT 6.7  ALBUMIN 3.1*   No results for input(s): LIPASE, AMYLASE in the last 168 hours. No results for input(s): AMMONIA in the last 168 hours. CBC:  Recent Labs Lab 11/07/16 2353 11/09/16 0707  WBC 9.7 8.8  HGB 13.8 12.9  HCT 42.4 40.5  MCV 93.2 94.4  PLT 197 142*   Cardiac Enzymes:  Recent Labs Lab 11/07/16 2353 11/08/16 0337 11/08/16 0758 11/08/16 1312 11/08/16 1908  TROPONINI <0.03 <0.03 <0.03 <0.03 <0.03   BNP: Invalid input(s): POCBNP CBG: No results for input(s): GLUCAP in the last 168 hours. D-Dimer No results for  input(s): DDIMER in the last 72 hours. Hgb A1c  Recent Labs  11/08/16 0758  HGBA1C 5.5   Lipid Profile  Recent Labs  11/08/16 0758  CHOL 228*  HDL 68  LDLCALC 133*  TRIG 135  CHOLHDL 3.4   Thyroid function studies  Recent Labs  11/08/16 0758  TSH 4.613*   Anemia work up No results for input(s): VITAMINB12, FOLATE, FERRITIN, TIBC, IRON, RETICCTPCT in the last 72 hours. Urinalysis    Component Value Date/Time   COLORURINE YELLOW 11/08/2016 0744   APPEARANCEUR HAZY (A) 11/08/2016 0744   LABSPEC 1.015 11/08/2016 0744   PHURINE 6.0 11/08/2016 0744   GLUCOSEU NEGATIVE 11/08/2016 0744   HGBUR NEGATIVE 11/08/2016 0744   BILIRUBINUR NEGATIVE 11/08/2016 0744   KETONESUR NEGATIVE 11/08/2016 0744   PROTEINUR NEGATIVE 11/08/2016 0744   NITRITE NEGATIVE 11/08/2016 0744   LEUKOCYTESUR LARGE (A) 11/08/2016 0744   Sepsis Labs Invalid input(s): PROCALCITONIN,  WBC,  LACTICIDVEN Microbiology Recent Results (from the past 240 hour(s))  MRSA PCR Screening     Status: None   Collection Time: 11/08/16  4:13 PM  Result Value Ref Range Status   MRSA by PCR NEGATIVE NEGATIVE Final    Comment:        The GeneXpert MRSA Assay (FDA approved for NASAL specimens only), is one component of a comprehensive MRSA colonization surveillance program. It is not intended to diagnose  MRSA infection nor to guide or monitor treatment for MRSA infections.      Time coordinating discharge: Over 30 minutes  SIGNED:  Latrelle Dodrill, MD  [facility] 11/09/2016, 3:25 PM Pager   If 7PM-7AM, please contact night-coverage [email or web address] Password [email or web address]

## 2017-06-25 IMAGING — DX DG CHEST 2V
2 series · 2 of 2 positions shown · non-contrast
Comparison: None.

CLINICAL DATA: Left-sided chest pain and left arm pain for 2 hours.

EXAM:
CHEST  2 VIEW

[chest pa]
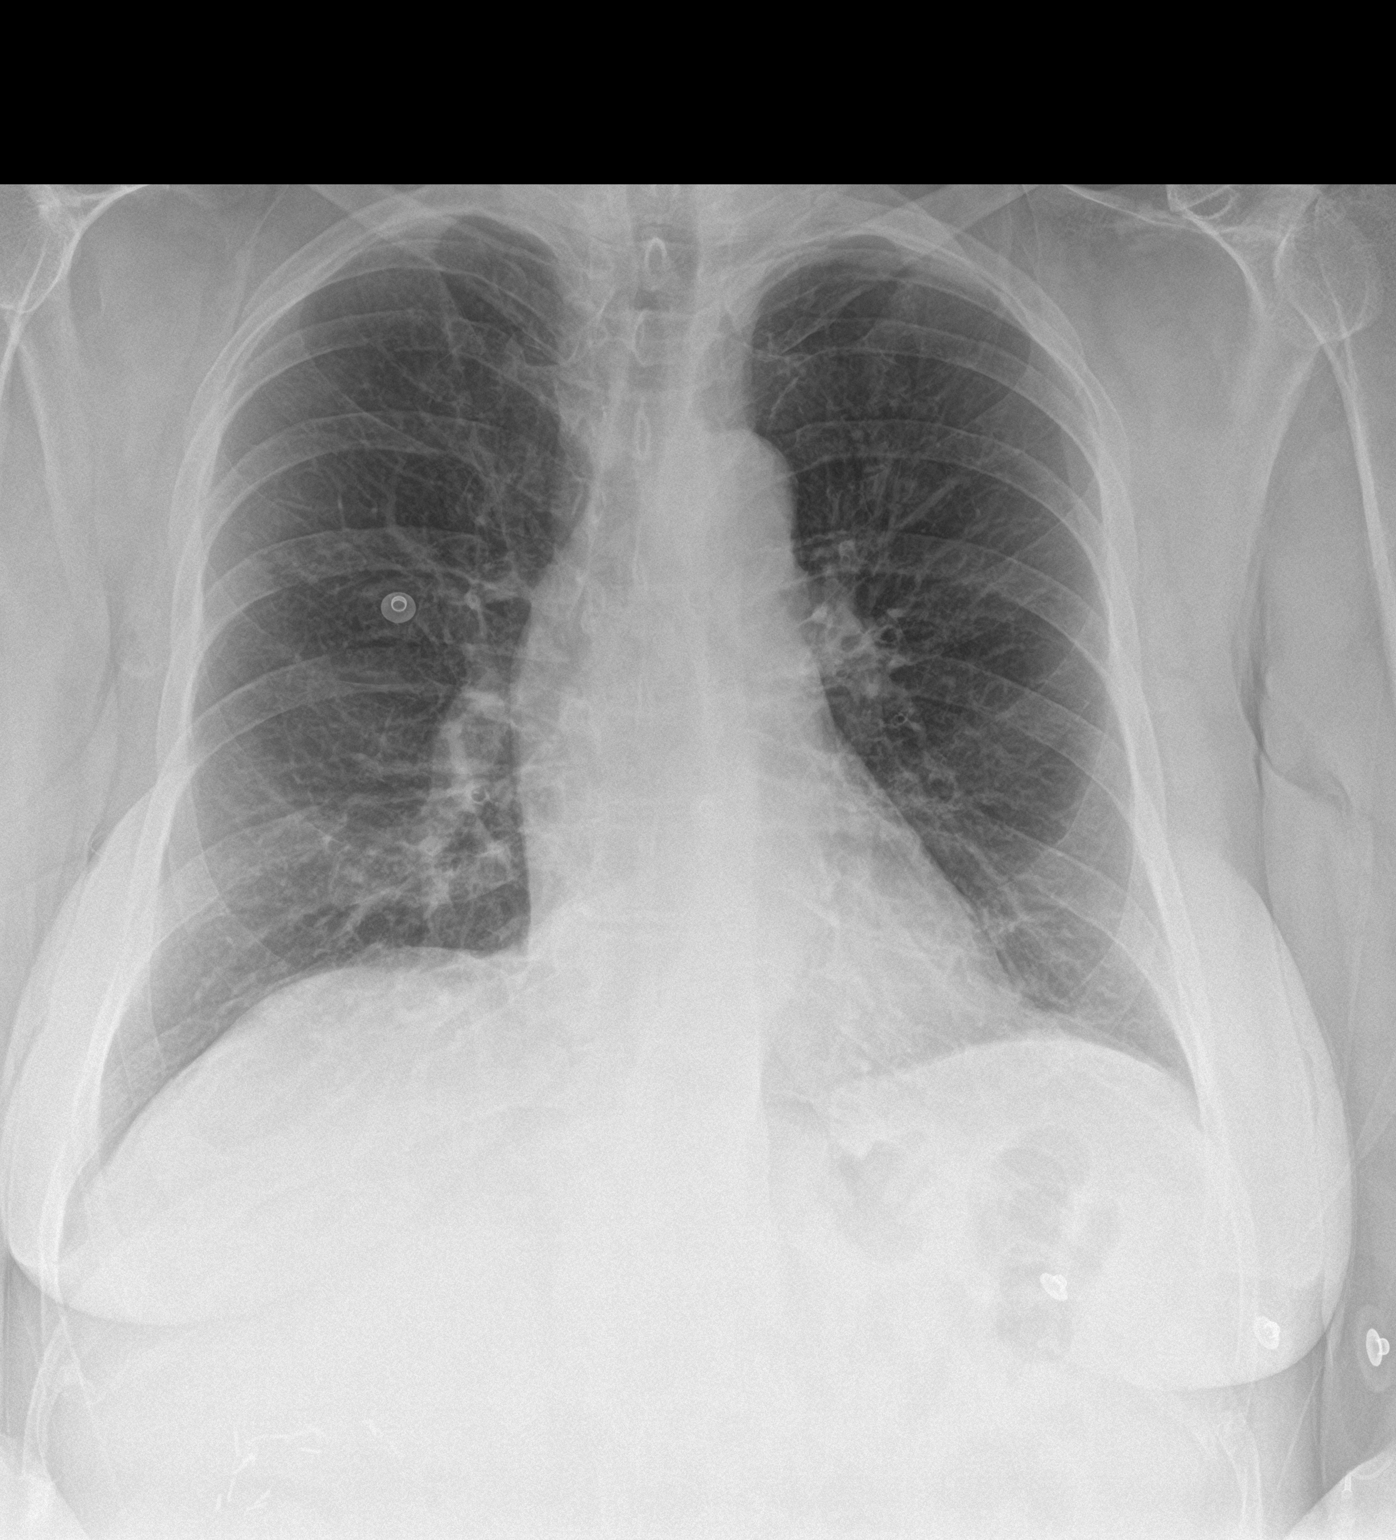

[chest lat]
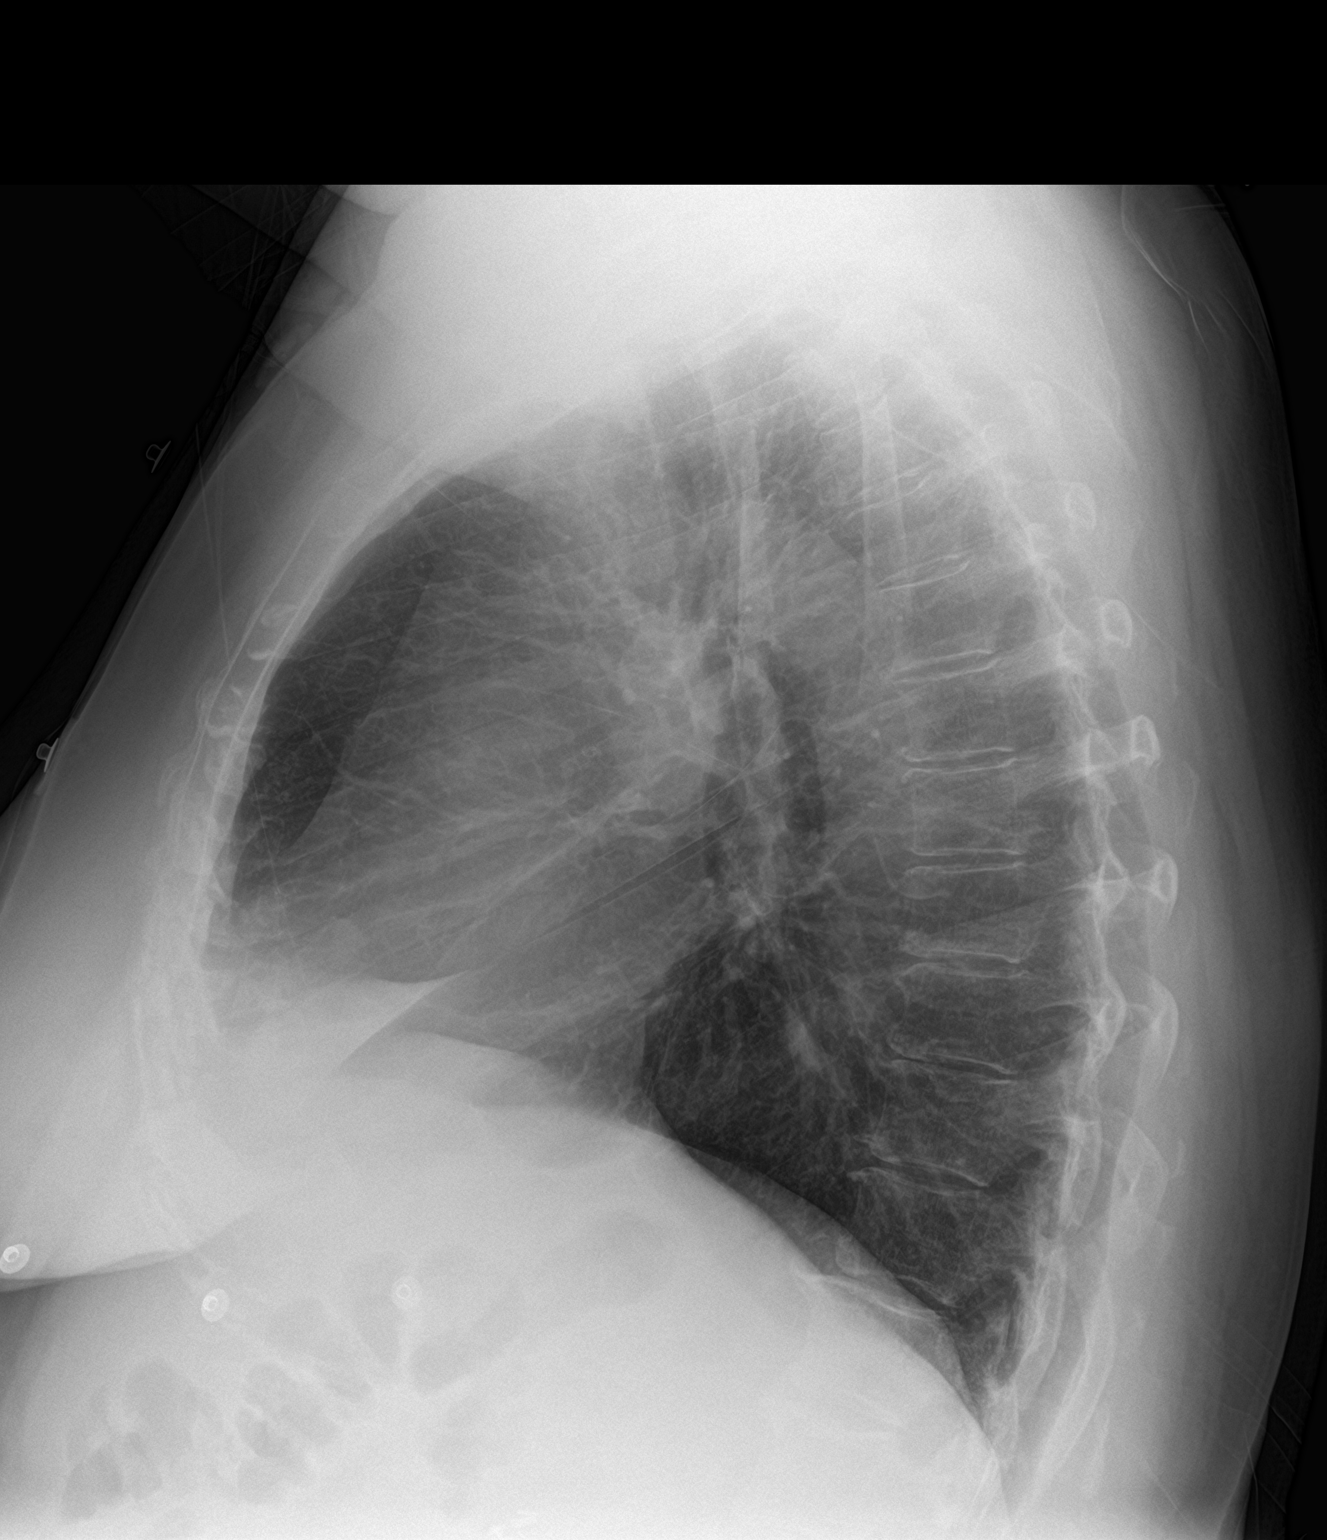

[2 of 2 positions shown; findings below may reference images not displayed]

FINDINGS: The cardiomediastinal contours are normal. The lungs are clear.
Pulmonary vasculature is normal. No consolidation, pleural effusion,
or pneumothorax. No acute osseous abnormalities are seen.
IMPRESSION: No acute pulmonary process.
